# Patient Record
Sex: Male | Born: 1980 | Race: White | Hispanic: No | Marital: Married | Smoking: Former smoker
Health system: Southern US, Community
[De-identification: ages and names within clinical notes are randomized; demographics above are authoritative.]

## PROBLEM LIST (undated history)

## (undated) DIAGNOSIS — B019 Varicella without complication: Secondary | ICD-10-CM

## (undated) DIAGNOSIS — J020 Streptococcal pharyngitis: Secondary | ICD-10-CM

## (undated) DIAGNOSIS — Z789 Other specified health status: Secondary | ICD-10-CM

## (undated) HISTORY — PX: OTHER SURGICAL HISTORY: SHX169

## (undated) HISTORY — DX: Streptococcal pharyngitis: J02.0

## (undated) HISTORY — PX: NO PAST SURGERIES: SHX2092

## (undated) HISTORY — DX: Varicella without complication: B01.9

---

## 2009-03-22 ENCOUNTER — Ambulatory Visit: Payer: Self-pay | Admitting: Family Medicine

## 2009-03-22 DIAGNOSIS — F17200 Nicotine dependence, unspecified, uncomplicated: Secondary | ICD-10-CM | POA: Insufficient documentation

## 2009-03-22 DIAGNOSIS — R21 Rash and other nonspecific skin eruption: Secondary | ICD-10-CM | POA: Insufficient documentation

## 2009-03-23 ENCOUNTER — Encounter: Payer: Self-pay | Admitting: Family Medicine

## 2009-11-10 ENCOUNTER — Ambulatory Visit: Payer: Self-pay | Admitting: Family Medicine

## 2010-09-26 NOTE — Assessment & Plan Note (Signed)
Summary: ? SPIDER BITE/ 3:45   Vital Signs:  Patient profile:   30 year old male Height:      73 inches Weight:      264.25 pounds BMI:     34.99 Temp:     98.4 degrees F oral Pulse rate:   88 / minute Pulse rhythm:   regular BP sitting:   126 / 84  (left arm) Cuff size:   large  Vitals Entered By: Delilah Shan CMA Duncan Dull) (November 10, 2009 2:11 PM) CC: ? spider bite   History of Present Illness: 30 yo here for ?spider bite. Woke up this morning with what appeared to be small insect bite on right upper arm. Since then, it has become bigger, pus filled, with small ring of erythema around it. Erythema is not spreading.  No fevers, chills, nausea, vomiting, body aches, HA or other systemic symptoms. He did not see an insect bite him.  He is a Emergency planning/management officer, he is worried that if it pops on its own will become infected. No personal contact with anyone with similar lesions. Did not burn himself.  Tetanus UTD.  Current Medications (verified): 1)  Doxycycline Hyclate 100 Mg Caps (Doxycycline Hyclate) .... Take 1 Tab Twice A Day X 10 Days  Allergies (verified): No Known Drug Allergies  Review of Systems      See HPI General:  Denies chills, fever, and malaise. GI:  Denies diarrhea, nausea, and vomiting.  Physical Exam  General:  Well-developed,well-nourished,in no acute distress; alert,appropriate and cooperative throughout examination Skin:  2 cm liquid filled pustule, mild surrounding erythema, no necrosis Axillary Nodes:  No palpable lymphadenopathy Psych:  Cognition and judgment appear intact. Alert and cooperative with normal attention span and concentration. 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  Has central bliseter, no mottling and amound of surrounding erythema.   No systemic symptoms. Cleaned and drained,  applied band aid.  Advised cold compresses, elevation. Will give doxycline 100 mg two times a day x 10 days. Pt advised to take pictures of it since difficult to see, call MD on call if develops systemic symptoms.  Complete Medication List: 1)  Doxycycline Hyclate 100 Mg Caps (Doxycycline hyclate) .... Take 1 tab twice a day x 10 days Prescriptions: DOXYCYCLINE HYCLATE 100 MG CAPS (DOXYCYCLINE HYCLATE) Take 1 tab twice a day x 10 days  #20 x 0   Entered and Authorized by:   Ruthe Mannan MD   Signed by:   Ruthe Mannan MD on 11/10/2009   Method used:   Electronically to        [facility] (retail)       [address], [address]       [place], [place]  [address]       Ph: [phone]       Fax: [phone]   RxID:   [id number]   Prior Medications (reviewed today): None Current Allergies (reviewed today): No known allergies    Procedure Note  Incision & Drainage:  Procedure # 1: I & D    Size (in cm): 2.0 x 3.0    Region: medial    Location: arm-upper-right    Instrument used: #10 blade    Anesthesia: none    Deep Suture: none  Cleaned and prepped with: alcohol and  betadine Wound dressing: bacitracin and bandaid  TD Result Date:  07/02/2008 TD Result:  historical TD Next Due:  10 yr

## 2012-01-30 ENCOUNTER — Encounter (HOSPITAL_BASED_OUTPATIENT_CLINIC_OR_DEPARTMENT_OTHER): Payer: Self-pay | Admitting: *Deleted
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
## 2012-01-31 ENCOUNTER — Encounter (HOSPITAL_BASED_OUTPATIENT_CLINIC_OR_DEPARTMENT_OTHER): Payer: Self-pay | Admitting: Anesthesiology

## 2012-01-31 ENCOUNTER — Ambulatory Visit (HOSPITAL_BASED_OUTPATIENT_CLINIC_OR_DEPARTMENT_OTHER)
Admission: RE | Admit: 2012-01-31 | Discharge: 2012-01-31 | Disposition: A | Discharge status: Home / Self Care | Payer: Worker's Compensation | Source: Ambulatory Visit | Attending: Orthopedic Surgery | Admitting: Orthopedic Surgery

## 2012-01-31 ENCOUNTER — Encounter (HOSPITAL_BASED_OUTPATIENT_CLINIC_OR_DEPARTMENT_OTHER)
Admission: RE | Disposition: A | Discharge status: Home / Self Care | Payer: Self-pay | Source: Ambulatory Visit | Attending: Orthopedic Surgery

## 2012-01-31 ENCOUNTER — Encounter (HOSPITAL_BASED_OUTPATIENT_CLINIC_OR_DEPARTMENT_OTHER): Payer: Self-pay

## 2012-01-31 ENCOUNTER — Encounter (HOSPITAL_BASED_OUTPATIENT_CLINIC_OR_DEPARTMENT_OTHER): Payer: Self-pay | Admitting: Orthopedic Surgery

## 2012-01-31 ENCOUNTER — Ambulatory Visit (HOSPITAL_BASED_OUTPATIENT_CLINIC_OR_DEPARTMENT_OTHER): Payer: Worker's Compensation | Admitting: Anesthesiology

## 2012-01-31 DIAGNOSIS — S41109A Unspecified open wound of unspecified upper arm, initial encounter: Secondary | ICD-10-CM | POA: Insufficient documentation

## 2012-01-31 DIAGNOSIS — Y990 Civilian activity done for income or pay: Secondary | ICD-10-CM | POA: Insufficient documentation

## 2012-01-31 DIAGNOSIS — W268XXA Contact with other sharp object(s), not elsewhere classified, initial encounter: Secondary | ICD-10-CM | POA: Insufficient documentation

## 2012-01-31 DIAGNOSIS — Y9289 Other specified places as the place of occurrence of the external cause: Secondary | ICD-10-CM | POA: Insufficient documentation

## 2012-01-31 DIAGNOSIS — S46909A Unspecified injury of unspecified muscle, fascia and tendon at shoulder and upper arm level, unspecified arm, initial encounter: Secondary | ICD-10-CM | POA: Insufficient documentation

## 2012-01-31 HISTORY — DX: Other specified health status: Z78.9

## 2012-01-31 HISTORY — PX: TENDON REPAIR: SHX5111

## 2012-01-31 SURGERY — TENDON REPAIR
Anesthesia: Monitor Anesthesia Care | Site: Arm Lower | Laterality: Right | Wound class: Clean

## 2012-01-31 MED ORDER — HYDROCODONE-ACETAMINOPHEN 5-325 MG PO TABS: ORAL_TABLET | ORAL | Status: AC

## 2012-01-31 MED ORDER — DOXYCYCLINE HYCLATE 100 MG PO TABS: 100.0000 mg | ORAL_TABLET | Freq: Two times a day (BID) | ORAL | Status: AC

## 2012-01-31 MED ORDER — MIDAZOLAM HCL 5 MG/5ML IJ SOLN
INTRAMUSCULAR | Status: DC | PRN
  Administered 2012-01-31: 2 mg via INTRAVENOUS

## 2012-01-31 MED ORDER — FENTANYL CITRATE 0.05 MG/ML IJ SOLN
INTRAMUSCULAR | Status: DC | PRN
  Administered 2012-01-31: 50 ug via INTRAVENOUS
  Administered 2012-01-31: 25 ug via INTRAVENOUS

## 2012-01-31 MED ORDER — PROPOFOL 10 MG/ML IV EMUL
INTRAVENOUS | Status: DC | PRN
  Administered 2012-01-31: 75 ug/kg/min via INTRAVENOUS

## 2012-01-31 MED ORDER — LIDOCAINE HCL 2 % IJ SOLN
INTRAMUSCULAR | Status: DC | PRN
  Administered 2012-01-31: 3.5 mL

## 2012-01-31 MED ORDER — LACTATED RINGERS IV SOLN
INTRAVENOUS | Status: DC
  Administered 2012-01-31 (×2): via INTRAVENOUS

## 2012-01-31 SURGICAL SUPPLY — 80 items
BANDAGE ADHESIVE 1X3 (GAUZE/BANDAGES/DRESSINGS) IMPLANT
BANDAGE ELASTIC 3 VELCRO ST LF (GAUZE/BANDAGES/DRESSINGS) ×2 IMPLANT
BANDAGE GAUZE ELAST BULKY 4 IN (GAUZE/BANDAGES/DRESSINGS) ×2 IMPLANT
BLADE MINI RND TIP GREEN BEAV (BLADE) IMPLANT
BLADE SURG 15 STRL LF DISP TIS (BLADE) ×1 IMPLANT
BLADE SURG 15 STRL SS (BLADE) ×1
BNDG COHESIVE 1X5 TAN STRL LF (GAUZE/BANDAGES/DRESSINGS) IMPLANT
BNDG ELASTIC 2 VLCR STRL LF (GAUZE/BANDAGES/DRESSINGS) IMPLANT
BNDG ESMARK 4X9 LF (GAUZE/BANDAGES/DRESSINGS) ×2 IMPLANT
BRUSH SCRUB EZ PLAIN DRY (MISCELLANEOUS) ×2 IMPLANT
CATH ROBINSON RED A/P 10FR (CATHETERS) IMPLANT
CATH ROBINSON RED A/P 12FR (CATHETERS) IMPLANT
CLOTH BEACON ORANGE TIMEOUT ST (SAFETY) ×2 IMPLANT
CORDS BIPOLAR (ELECTRODE) ×2 IMPLANT
COVER MAYO STAND STRL (DRAPES) ×2 IMPLANT
COVER TABLE BACK 60X90 (DRAPES) ×2 IMPLANT
CUFF TOURNIQUET SINGLE 18IN (TOURNIQUET CUFF) ×2 IMPLANT
DECANTER SPIKE VIAL GLASS SM (MISCELLANEOUS) IMPLANT
DRAIN PENROSE 1/4X12 LTX STRL (WOUND CARE) IMPLANT
DRAPE EXTREMITY T 121X128X90 (DRAPE) ×2 IMPLANT
DRAPE SURG 17X23 STRL (DRAPES) ×2 IMPLANT
GAUZE SPONGE 4X4 16PLY XRAY LF (GAUZE/BANDAGES/DRESSINGS) IMPLANT
GAUZE XEROFORM 1X8 LF (GAUZE/BANDAGES/DRESSINGS) IMPLANT
GLOVE BIO SURGEON STRL SZ 6.5 (GLOVE) ×4 IMPLANT
GLOVE BIOGEL M STRL SZ7.5 (GLOVE) ×2 IMPLANT
GLOVE BIOGEL PI IND STRL 6.5 (GLOVE) ×1 IMPLANT
GLOVE BIOGEL PI INDICATOR 6.5 (GLOVE) ×1
GLOVE ORTHO TXT STRL SZ7.5 (GLOVE) ×2 IMPLANT
GOWN PREVENTION PLUS XLARGE (GOWN DISPOSABLE) ×2 IMPLANT
GOWN PREVENTION PLUS XXLARGE (GOWN DISPOSABLE) ×4 IMPLANT
NEEDLE 27GAX1X1/2 (NEEDLE) ×2 IMPLANT
NEEDLE ADDISON D1/2 CIR (NEEDLE) IMPLANT
NEEDLE HYPO 25X1 1.5 SAFETY (NEEDLE) IMPLANT
NEEDLE KEITH (NEEDLE) IMPLANT
NEEDLE KEITH SZ10 STRAIGHT (NEEDLE) IMPLANT
NS IRRIG 1000ML POUR BTL (IV SOLUTION) ×2 IMPLANT
PACK BASIN DAY SURGERY FS (CUSTOM PROCEDURE TRAY) ×2 IMPLANT
PAD CAST 3X4 CTTN HI CHSV (CAST SUPPLIES) ×1 IMPLANT
PADDING CAST ABS 3INX4YD NS (CAST SUPPLIES)
PADDING CAST ABS 4INX4YD NS (CAST SUPPLIES) ×1
PADDING CAST ABS COTTON 3X4 (CAST SUPPLIES) IMPLANT
PADDING CAST ABS COTTON 4X4 ST (CAST SUPPLIES) ×1 IMPLANT
PADDING CAST COTTON 3X4 STRL (CAST SUPPLIES) ×1
PASSER SUT SWANSON 36MM LOOP (INSTRUMENTS) IMPLANT
SLEEVE SCD COMPRESS KNEE MED (MISCELLANEOUS) IMPLANT
SPLINT PLASTER CAST XFAST 3X15 (CAST SUPPLIES) ×8 IMPLANT
SPLINT PLASTER XTRA FASTSET 3X (CAST SUPPLIES) ×8
SPONGE GAUZE 4X4 12PLY (GAUZE/BANDAGES/DRESSINGS) ×2 IMPLANT
STOCKINETTE 4X48 STRL (DRAPES) ×2 IMPLANT
STOCKINETTE 6  STRL (DRAPES)
STOCKINETTE 6 STRL (DRAPES) IMPLANT
STRIP CLOSURE SKIN 1/2X4 (GAUZE/BANDAGES/DRESSINGS) ×2 IMPLANT
SUT ETHIBOND 3-0 V-5 (SUTURE) ×2 IMPLANT
SUT ETHILON 5 0 P 3 18 (SUTURE)
SUT FIBERWIRE 3-0 18 TAPR NDL (SUTURE)
SUT FIBERWIRE 4-0 18 TAPR NDL (SUTURE)
SUT MERSILENE 4 0 P 3 (SUTURE) IMPLANT
SUT MERSILENE 6 0 P 1 (SUTURE) IMPLANT
SUT MERSILENE 6 0 S14 DA (SUTURE) IMPLANT
SUT NYLON ETHILON 5-0 P-3 1X18 (SUTURE) IMPLANT
SUT POLY BUTTON 15MM (SUTURE) IMPLANT
SUT PROLENE 2 0 SH DA (SUTURE) IMPLANT
SUT PROLENE 3 0 PS 2 (SUTURE) ×2 IMPLANT
SUT PROLENE 4 0 PS 2 18 (SUTURE) IMPLANT
SUT SILK 4 0 PS 2 (SUTURE) IMPLANT
SUT VIC AB 4-0 P-3 18XBRD (SUTURE) ×1 IMPLANT
SUT VIC AB 4-0 P3 18 (SUTURE) ×1
SUT VIC AB 4-0 SH 27 (SUTURE)
SUT VIC AB 4-0 SH 27XANBCTRL (SUTURE) IMPLANT
SUTURE FIBERWR 3-0 18 TAPR NDL (SUTURE) IMPLANT
SUTURE FIBERWR 4-0 18 TAPR NDL (SUTURE) IMPLANT
SYR 3ML 23GX1 SAFETY (SYRINGE) IMPLANT
SYR 5ML LL (SYRINGE) IMPLANT
SYR BULB 3OZ (MISCELLANEOUS) ×2 IMPLANT
SYR CONTROL 10ML LL (SYRINGE) ×2 IMPLANT
TOWEL OR 17X24 6PK STRL BLUE (TOWEL DISPOSABLE) ×2 IMPLANT
TRAY DSU PREP LF (CUSTOM PROCEDURE TRAY) ×2 IMPLANT
TUBE FEEDING 5FR 15 INCH (TUBING) IMPLANT
UNDERPAD 30X30 INCONTINENT (UNDERPADS AND DIAPERS) ×2 IMPLANT
WATER STERILE IRR 1000ML POUR (IV SOLUTION) IMPLANT

## 2012-01-31 NOTE — Discharge Instructions (Signed)

## 2012-01-31 NOTE — Brief Op Note (Signed)
01/31/2012  1:22 PM  PATIENT:  Ruben Robinson  31 y.o. male  PRE-OPERATIVE DIAGNOSIS:  laceration to right forearm with ER documented tendon laceration  POST-OPERATIVE DIAGNOSIS: 70% laceration of extensor carpi ulnaris  PROCEDURE:   DELAYED PRIMARY REPAIR OF EXTENSOR CARPI ULNARIS TENDON   SURGEON:  Surgeon(s) and Role:    * Wyn Forster., MD - Primary  PHYSICIAN ASSISTANT:   ASSISTANTS: Mallory Shirk.A-C   ANESTHESIA:   local  EBL:     BLOOD ADMINISTERED:none  DRAINS: none   LOCAL MEDICATIONS USED:  XYLOCAINE   SPECIMEN:  No Specimen  DISPOSITION OF SPECIMEN:  N/A  COUNTS:  YES  TOURNIQUET:  * Missing tourniquet times found for documented tourniquets in log:  [id number] *  DICTATION: .Other Dictation: Dictation Number [id number]  PLAN OF CARE: Discharge to home after PACU  PATIENT DISPOSITION:  PACU - hemodynamically stable.

## 2012-01-31 NOTE — Anesthesia Preprocedure Evaluation (Signed)
Anesthesia Evaluation  Patient identified by MRN, date of birth, ID band Patient awake    Reviewed: Allergy & Precautions, H&P , NPO status , Patient's Chart, lab work & pertinent test results  History of Anesthesia Complications Negative for: history of anesthetic complications  Airway Mallampati: I TM Distance: >3 FB Neck ROM: Full    Dental No notable dental hx. (+) Teeth Intact and Dental Advisory Given   Pulmonary neg pulmonary ROS, former smoker (quit a year ago) breath sounds clear to auscultation  Pulmonary exam normal       Cardiovascular negative cardio ROS  Rhythm:Regular Rate:Normal     Neuro/Psych negative neurological ROS     GI/Hepatic negative GI ROS, Neg liver ROS,   Endo/Other  negative endocrine ROS  Renal/GU negative Renal ROS     Musculoskeletal   Abdominal (+) + obese,   Peds  Hematology negative hematology ROS (+)   Anesthesia Other Findings   Reproductive/Obstetrics                           Anesthesia Physical Anesthesia Plan  ASA: II  Anesthesia Plan: MAC   Post-op Pain Management:    Induction: Intravenous  Airway Management Planned: Simple Face Mask  Additional Equipment:   Intra-op Plan:   Post-operative Plan:   Informed Consent: I have reviewed the patients History and Physical, chart, labs and discussed the procedure including the risks, benefits and alternatives for the proposed anesthesia with the patient or authorized representative who has indicated his/her understanding and acceptance.   Dental advisory given  Plan Discussed with: CRNA and Surgeon  Anesthesia Plan Comments: (Plan routine monitors, MAC with local by surgeon  )        Anesthesia Quick Evaluation

## 2012-01-31 NOTE — Op Note (Signed)
[id number] 

## 2012-01-31 NOTE — Transfer of Care (Signed)
Immediate Anesthesia Transfer of Care Note  Patient: Ruben Robinson  Procedure(s) Performed: Procedure(s) (LRB): TENDON REPAIR (Right)  Patient Location: PACU  Anesthesia Type: MAC  Level of Consciousness: awake, alert  and oriented  Airway & Oxygen Therapy: Patient Spontanous Breathing and Patient connected to face mask oxygen  Post-op Assessment: Report given to PACU RN and Post -op Vital signs reviewed and stable  Post vital signs: Reviewed and stable  Complications: No apparent anesthesia complications

## 2012-02-01 ENCOUNTER — Encounter (HOSPITAL_BASED_OUTPATIENT_CLINIC_OR_DEPARTMENT_OTHER): Payer: Self-pay | Admitting: Orthopedic Surgery
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
## 2012-02-02 NOTE — Anesthesia Postprocedure Evaluation (Signed)
Anesthesia Post Note  Patient: Ruben Robinson  Procedure(s) Performed: Procedure(s) (LRB): TENDON REPAIR (Right)  Anesthesia type: MAC  Patient location: PACU  Post pain: Pain level controlled  Post assessment: Patient's Cardiovascular Status Stable  Last Vitals:  Filed Vitals:   01/31/12 1417  BP: 119/63  Pulse: 54  Temp: 36.7 C  Resp: 18    Post vital signs: Reviewed and stable  Level of consciousness: sedated  Complications: No apparent anesthesia complications

## 2012-02-06 ENCOUNTER — Encounter (HOSPITAL_BASED_OUTPATIENT_CLINIC_OR_DEPARTMENT_OTHER): Payer: Self-pay

## 2012-04-09 ENCOUNTER — Ambulatory Visit (HOSPITAL_COMMUNITY)
Admission: EM | Admit: 2012-04-09 | Discharge: 2012-04-11 | Disposition: A | Discharge status: Home / Self Care | Payer: Worker's Compensation | Attending: Emergency Medicine | Admitting: Emergency Medicine

## 2012-04-09 ENCOUNTER — Encounter (HOSPITAL_COMMUNITY)
Admission: EM | Disposition: A | Discharge status: Home / Self Care | Payer: Self-pay | Source: Home / Self Care | Attending: Emergency Medicine

## 2012-04-09 ENCOUNTER — Emergency Department (HOSPITAL_COMMUNITY): Payer: Worker's Compensation | Admitting: Certified Registered Nurse Anesthetist

## 2012-04-09 ENCOUNTER — Inpatient Hospital Stay: Admit: 2012-04-09 | Payer: Self-pay | Admitting: Orthopedic Surgery

## 2012-04-09 ENCOUNTER — Encounter (HOSPITAL_COMMUNITY): Payer: Self-pay | Admitting: Certified Registered Nurse Anesthetist

## 2012-04-09 ENCOUNTER — Emergency Department (HOSPITAL_COMMUNITY): Payer: Worker's Compensation

## 2012-04-09 ENCOUNTER — Encounter (HOSPITAL_COMMUNITY): Payer: Self-pay | Admitting: Emergency Medicine

## 2012-04-09 DIAGNOSIS — S81859A Open bite, unspecified lower leg, initial encounter: Secondary | ICD-10-CM

## 2012-04-09 DIAGNOSIS — S92309B Fracture of unspecified metatarsal bone(s), unspecified foot, initial encounter for open fracture: Secondary | ICD-10-CM | POA: Insufficient documentation

## 2012-04-09 DIAGNOSIS — S92902A Unspecified fracture of left foot, initial encounter for closed fracture: Secondary | ICD-10-CM

## 2012-04-09 DIAGNOSIS — S81009A Unspecified open wound, unspecified knee, initial encounter: Secondary | ICD-10-CM | POA: Insufficient documentation

## 2012-04-09 DIAGNOSIS — S81059A Open bite, unspecified knee, initial encounter: Secondary | ICD-10-CM

## 2012-04-09 DIAGNOSIS — W540XXA Bitten by dog, initial encounter: Secondary | ICD-10-CM | POA: Diagnosis present

## 2012-04-09 DIAGNOSIS — S92302B Fracture of unspecified metatarsal bone(s), left foot, initial encounter for open fracture: Secondary | ICD-10-CM | POA: Diagnosis present

## 2012-04-09 DIAGNOSIS — S92213A Displaced fracture of cuboid bone of unspecified foot, initial encounter for closed fracture: Secondary | ICD-10-CM | POA: Insufficient documentation

## 2012-04-09 DIAGNOSIS — W3400XA Accidental discharge from unspecified firearms or gun, initial encounter: Secondary | ICD-10-CM

## 2012-04-09 DIAGNOSIS — Z1810 Retained metal fragments, unspecified: Secondary | ICD-10-CM | POA: Insufficient documentation

## 2012-04-09 DIAGNOSIS — S91309A Unspecified open wound, unspecified foot, initial encounter: Secondary | ICD-10-CM | POA: Insufficient documentation

## 2012-04-09 DIAGNOSIS — S91009A Unspecified open wound, unspecified ankle, initial encounter: Secondary | ICD-10-CM | POA: Insufficient documentation

## 2012-04-09 DIAGNOSIS — W320XXA Accidental handgun discharge, initial encounter: Secondary | ICD-10-CM | POA: Insufficient documentation

## 2012-04-09 DIAGNOSIS — S92919A Unspecified fracture of unspecified toe(s), initial encounter for closed fracture: Secondary | ICD-10-CM | POA: Insufficient documentation

## 2012-04-09 DIAGNOSIS — S92309A Fracture of unspecified metatarsal bone(s), unspecified foot, initial encounter for closed fracture: Secondary | ICD-10-CM | POA: Insufficient documentation

## 2012-04-09 HISTORY — PX: I & D EXTREMITY: SHX5045

## 2012-04-09 HISTORY — DX: Accidental discharge from unspecified firearms or gun, initial encounter: W34.00XA

## 2012-04-09 HISTORY — PX: PERCUTANEOUS PINNING: SHX2209

## 2012-04-09 HISTORY — DX: Open bite, unspecified lower leg, initial encounter: W54.0XXA

## 2012-04-09 LAB — CBC WITH DIFFERENTIAL/PLATELET
Basophils Relative: 0 % (ref 0–1)
Eosinophils Absolute: 0 10*3/uL (ref 0.0–0.7)
Eosinophils Relative: 0 % (ref 0–5)
HCT: 41.3 % (ref 39.0–52.0)
Hemoglobin: 14.5 g/dL (ref 13.0–17.0)
MCH: 29.3 pg (ref 26.0–34.0)
MCHC: 35.1 g/dL (ref 30.0–36.0)
Monocytes Absolute: 0.8 10*3/uL (ref 0.1–1.0)
Monocytes Relative: 5 % (ref 3–12)
RDW: 12.3 % (ref 11.5–15.5)

## 2012-04-09 LAB — BASIC METABOLIC PANEL
BUN: 21 mg/dL (ref 6–23)
Calcium: 9.1 mg/dL (ref 8.4–10.5)
Creatinine, Ser: 1.07 mg/dL (ref 0.50–1.35)
GFR calc Af Amer: >90 mL/min (ref >90)
GFR calc non Af Amer: >90 mL/min (ref >90)

## 2012-04-09 SURGERY — IRRIGATION AND DEBRIDEMENT EXTREMITY
Anesthesia: General | Site: Foot | Laterality: Left | Wound class: Contaminated

## 2012-04-09 MED ORDER — LACTATED RINGERS IV SOLN
INTRAVENOUS | Status: DC | PRN
  Administered 2012-04-09: 18:00:00 via INTRAVENOUS

## 2012-04-09 MED ORDER — MIDAZOLAM HCL 5 MG/5ML IJ SOLN
INTRAMUSCULAR | Status: DC | PRN
  Administered 2012-04-09: 2 mg via INTRAVENOUS

## 2012-04-09 MED ORDER — FENTANYL CITRATE 0.05 MG/ML IJ SOLN
INTRAMUSCULAR | Status: DC | PRN
  Administered 2012-04-09: 50 ug via INTRAVENOUS
  Administered 2012-04-09: 100 ug via INTRAVENOUS
  Administered 2012-04-09 (×2): 50 ug via INTRAVENOUS

## 2012-04-09 MED ORDER — ONDANSETRON HCL 4 MG/2ML IJ SOLN: 4.0000 mg | Freq: Four times a day (QID) | INTRAMUSCULAR | Status: DC | PRN

## 2012-04-09 MED ORDER — ONDANSETRON HCL 4 MG PO TABS: 4.0000 mg | ORAL_TABLET | Freq: Four times a day (QID) | ORAL | Status: DC | PRN

## 2012-04-09 MED ORDER — HYDROMORPHONE HCL PF 1 MG/ML IJ SOLN
0.2500 mg | INTRAMUSCULAR | Status: DC | PRN
  Administered 2012-04-09 (×4): 0.5 mg via INTRAVENOUS

## 2012-04-09 MED ORDER — OXYCODONE-ACETAMINOPHEN 5-325 MG PO TABS
1.0000 | ORAL_TABLET | ORAL | Status: DC | PRN
  Administered 2012-04-09 – 2012-04-11 (×6): 2 via ORAL
  Filled 2012-04-09 (×6): qty 2

## 2012-04-09 MED ORDER — HYDROMORPHONE HCL PF 1 MG/ML IJ SOLN
INTRAMUSCULAR | Status: AC
  Filled 2012-04-09: qty 1

## 2012-04-09 MED ORDER — HYDROMORPHONE HCL PF 1 MG/ML IJ SOLN
1.0000 mg | INTRAMUSCULAR | Status: DC | PRN
  Administered 2012-04-10 (×2): 1 mg via INTRAVENOUS
  Administered 2012-04-10 (×2): 2 mg via INTRAVENOUS
  Administered 2012-04-10 (×2): 1 mg via INTRAVENOUS
  Administered 2012-04-11 (×2): 2 mg via INTRAVENOUS
  Administered 2012-04-11: 1 mg via INTRAVENOUS
  Filled 2012-04-09 (×2): qty 1
  Filled 2012-04-09: qty 2
  Filled 2012-04-09 (×4): qty 1
  Filled 2012-04-09 (×3): qty 2

## 2012-04-09 MED ORDER — PROPOFOL 10 MG/ML IV EMUL
INTRAVENOUS | Status: DC | PRN
  Administered 2012-04-09: 200 mg via INTRAVENOUS

## 2012-04-09 MED ORDER — ASPIRIN EC 325 MG PO TBEC
325.0000 mg | DELAYED_RELEASE_TABLET | Freq: Two times a day (BID) | ORAL | Status: DC
  Administered 2012-04-10 – 2012-04-11 (×3): 325 mg via ORAL
  Filled 2012-04-09 (×5): qty 1

## 2012-04-09 MED ORDER — HYDROMORPHONE HCL PF 1 MG/ML IJ SOLN
1.0000 mg | Freq: Once | INTRAMUSCULAR | Status: AC
  Administered 2012-04-09: 1 mg via INTRAVENOUS
  Filled 2012-04-09: qty 1

## 2012-04-09 MED ORDER — PIPERACILLIN-TAZOBACTAM 3.375 G IVPB
3.3750 g | Freq: Once | INTRAVENOUS | Status: AC
  Administered 2012-04-09: 3.375 g via INTRAVENOUS
  Filled 2012-04-09: qty 50

## 2012-04-09 MED ORDER — PIPERACILLIN-TAZOBACTAM 3.375 G IVPB
3.3750 g | Freq: Four times a day (QID) | INTRAVENOUS | Status: AC
  Administered 2012-04-10 (×3): 3.375 g via INTRAVENOUS
  Filled 2012-04-09 (×3): qty 50

## 2012-04-09 MED ORDER — DEXTROSE-NACL 5-0.45 % IV SOLN
INTRAVENOUS | Status: DC
  Administered 2012-04-09: 23:00:00 via INTRAVENOUS
  Administered 2012-04-11: 75 mL via INTRAVENOUS

## 2012-04-09 MED ORDER — LIDOCAINE HCL (CARDIAC) 20 MG/ML IV SOLN
INTRAVENOUS | Status: DC | PRN
  Administered 2012-04-09: 100 mg via INTRAVENOUS

## 2012-04-09 MED ORDER — ONDANSETRON HCL 4 MG/2ML IJ SOLN
INTRAMUSCULAR | Status: DC | PRN
  Administered 2012-04-09: 4 mg via INTRAVENOUS

## 2012-04-09 MED ORDER — LORAZEPAM 1 MG PO TABS
1.0000 mg | ORAL_TABLET | Freq: Once | ORAL | Status: AC
  Administered 2012-04-09: 1 mg via ORAL
  Filled 2012-04-09 (×2): qty 1

## 2012-04-09 MED ORDER — ONDANSETRON HCL 4 MG/2ML IJ SOLN
4.0000 mg | Freq: Once | INTRAMUSCULAR | Status: AC
  Administered 2012-04-09: 4 mg via INTRAVENOUS
  Filled 2012-04-09: qty 2

## 2012-04-09 MED ORDER — ZOLPIDEM TARTRATE 5 MG PO TABS: 5.0000 mg | ORAL_TABLET | Freq: Every evening | ORAL | Status: DC | PRN

## 2012-04-09 MED ORDER — DROPERIDOL 2.5 MG/ML IJ SOLN: 0.6250 mg | INTRAMUSCULAR | Status: DC | PRN

## 2012-04-09 SURGICAL SUPPLY — 70 items
BAG DECANTER FOR FLEXI CONT (MISCELLANEOUS) IMPLANT
BANDAGE ELASTIC 3 VELCRO ST LF (GAUZE/BANDAGES/DRESSINGS) IMPLANT
BANDAGE ELASTIC 4 VELCRO ST LF (GAUZE/BANDAGES/DRESSINGS) ×8 IMPLANT
BANDAGE ELASTIC 6 VELCRO ST LF (GAUZE/BANDAGES/DRESSINGS) IMPLANT
BANDAGE ESMARK 6X9 LF (GAUZE/BANDAGES/DRESSINGS) IMPLANT
BANDAGE GAUZE ELAST BULKY 4 IN (GAUZE/BANDAGES/DRESSINGS) ×8 IMPLANT
BENZOIN TINCTURE PRP APPL 2/3 (GAUZE/BANDAGES/DRESSINGS) IMPLANT
BLADE SURG ROTATE 9660 (MISCELLANEOUS) IMPLANT
BNDG COHESIVE 4X5 TAN STRL (GAUZE/BANDAGES/DRESSINGS) ×2 IMPLANT
BNDG ESMARK 6X9 LF (GAUZE/BANDAGES/DRESSINGS)
CAP PIN PROTECTOR ORTHO WHT (CAP) ×2 IMPLANT
CLOTH BEACON ORANGE TIMEOUT ST (SAFETY) ×2 IMPLANT
CONT SPEC STER OR (MISCELLANEOUS) ×2 IMPLANT
COVER SURGICAL LIGHT HANDLE (MISCELLANEOUS) ×2 IMPLANT
CUFF TOURNIQUET SINGLE 18IN (TOURNIQUET CUFF) IMPLANT
CUFF TOURNIQUET SINGLE 24IN (TOURNIQUET CUFF) IMPLANT
CUFF TOURNIQUET SINGLE 34IN LL (TOURNIQUET CUFF) ×2 IMPLANT
CUFF TOURNIQUET SINGLE 44IN (TOURNIQUET CUFF) IMPLANT
DRAPE OEC MINIVIEW 54X84 (DRAPES) ×2 IMPLANT
DRAPE U-SHAPE 47X51 STRL (DRAPES) ×2 IMPLANT
DRSG ADAPTIC 3X8 NADH LF (GAUZE/BANDAGES/DRESSINGS) IMPLANT
DRSG EMULSION OIL 3X3 NADH (GAUZE/BANDAGES/DRESSINGS) IMPLANT
DRSG PAD ABDOMINAL 8X10 ST (GAUZE/BANDAGES/DRESSINGS) ×2 IMPLANT
DURAPREP 26ML APPLICATOR (WOUND CARE) ×2 IMPLANT
ELECT CAUTERY BLADE 6.4 (BLADE) IMPLANT
ELECT REM PT RETURN 9FT ADLT (ELECTROSURGICAL)
ELECTRODE REM PT RTRN 9FT ADLT (ELECTROSURGICAL) IMPLANT
GAUZE XEROFORM 1X8 LF (GAUZE/BANDAGES/DRESSINGS) IMPLANT
GAUZE XEROFORM 5X9 LF (GAUZE/BANDAGES/DRESSINGS) ×2 IMPLANT
GLOVE BIO SURGEON STRL SZ 6.5 (GLOVE) ×2 IMPLANT
GLOVE BIOGEL PI IND STRL 8 (GLOVE) ×2 IMPLANT
GLOVE BIOGEL PI INDICATOR 8 (GLOVE) ×2
GLOVE ECLIPSE 7.5 STRL STRAW (GLOVE) ×4 IMPLANT
GOWN PREVENTION PLUS XLARGE (GOWN DISPOSABLE) ×2 IMPLANT
GOWN SRG XL XLNG 56XLVL 4 (GOWN DISPOSABLE) ×1 IMPLANT
GOWN STRL NON-REIN LRG LVL3 (GOWN DISPOSABLE) ×4 IMPLANT
GOWN STRL NON-REIN XL XLG LVL4 (GOWN DISPOSABLE) ×1
HANDPIECE INTERPULSE COAX TIP (DISPOSABLE)
K-WIRE PROS 0.6X70 (WIRE) ×2
KIT BASIN OR (CUSTOM PROCEDURE TRAY) ×2 IMPLANT
KIT ROOM TURNOVER OR (KITS) ×2 IMPLANT
KWIRE PROS 0.6X70 (WIRE) ×1 IMPLANT
MANIFOLD NEPTUNE II (INSTRUMENTS) ×2 IMPLANT
NS IRRIG 1000ML POUR BTL (IV SOLUTION) ×2 IMPLANT
PACK ORTHO EXTREMITY (CUSTOM PROCEDURE TRAY) ×2 IMPLANT
PAD ARMBOARD 7.5X6 YLW CONV (MISCELLANEOUS) ×2 IMPLANT
PAD CAST 4YDX4 CTTN HI CHSV (CAST SUPPLIES) ×1 IMPLANT
PADDING CAST ABS 4INX4YD NS (CAST SUPPLIES) ×1
PADDING CAST ABS COTTON 4X4 ST (CAST SUPPLIES) ×1 IMPLANT
PADDING CAST COTTON 4X4 STRL (CAST SUPPLIES) ×1
SET HNDPC FAN SPRY TIP SCT (DISPOSABLE) IMPLANT
SPONGE GAUZE 4X4 12PLY (GAUZE/BANDAGES/DRESSINGS) ×4 IMPLANT
SPONGE LAP 18X18 X RAY DECT (DISPOSABLE) IMPLANT
STOCKINETTE IMPERVIOUS 9X36 MD (GAUZE/BANDAGES/DRESSINGS) ×2 IMPLANT
STOCKINETTE TUBULAR SYNTH 4IN (CAST SUPPLIES) ×2 IMPLANT
STRIP CLOSURE SKIN 1/2X4 (GAUZE/BANDAGES/DRESSINGS) IMPLANT
SUCTION FRAZIER TIP 10 FR DISP (SUCTIONS) ×2 IMPLANT
SUT ETHILON 2 0 FS 18 (SUTURE) ×4 IMPLANT
SUT ETHILON 2 0 PSLX (SUTURE) ×2 IMPLANT
SUT ETHILON 4 0 P 3 18 (SUTURE) IMPLANT
SUT ETHILON 5 0 P 3 18 (SUTURE)
SUT NYLON ETHILON 5-0 P-3 1X18 (SUTURE) IMPLANT
SUT PROLENE 4 0 P 3 18 (SUTURE) IMPLANT
TOWEL OR 17X24 6PK STRL BLUE (TOWEL DISPOSABLE) ×2 IMPLANT
TOWEL OR 17X26 10 PK STRL BLUE (TOWEL DISPOSABLE) ×2 IMPLANT
TUBE ANAEROBIC SPECIMEN COL (MISCELLANEOUS) IMPLANT
TUBE CONNECTING 12X1/4 (SUCTIONS) ×2 IMPLANT
UNDERPAD 30X30 INCONTINENT (UNDERPADS AND DIAPERS) ×2 IMPLANT
WATER STERILE IRR 1000ML POUR (IV SOLUTION) IMPLANT
YANKAUER SUCT BULB TIP NO VENT (SUCTIONS) ×2 IMPLANT

## 2012-04-09 NOTE — ED Notes (Signed)
Pt bleed through dressing.
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
## 2012-04-09 NOTE — ED Notes (Signed)
Pt has good pulses in left foot.

## 2012-04-09 NOTE — Progress Notes (Signed)
Orthopedic Tech Progress Note Patient Details:  Ruben Robinson May 08, 1981 [id number]  Patient ID: Ruben Robinson, male   DOB: 1980-10-19, 31 y.o.   MRN: [id number] Viewed order from doctor's order list  Nikki Dom 04/09/2012, 8:24 PM
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
## 2012-04-09 NOTE — Anesthesia Postprocedure Evaluation (Signed)
Anesthesia Post Note  Patient: Ruben Robinson  Procedure(s) Performed: Procedure(s) (LRB): IRRIGATION AND DEBRIDEMENT EXTREMITY (Left) PERCUTANEOUS PINNING EXTREMITY (Left)  Anesthesia type: general  Patient location: PACU  Post pain: Pain level controlled  Post assessment: Patient's Cardiovascular Status Stable  Last Vitals:  Filed Vitals:   04/09/12 2015  BP: 127/70  Pulse:   Temp:   Resp:     Post vital signs: Reviewed and stable  Level of consciousness: sedated  Complications: No apparent anesthesia complications

## 2012-04-09 NOTE — ED Notes (Signed)
Changed dressing on left foot with 4X4s then wrapped it tightly.

## 2012-04-09 NOTE — Progress Notes (Signed)
Orthopedic Tech Progress Note Patient Details:  Ruben Robinson 11-24-1980 [id number]  Ortho Devices Type of Ortho Device: Crutches Ortho Device/Splint Interventions: [name], [name] 04/09/2012, 8:24 PM

## 2012-04-09 NOTE — Anesthesia Preprocedure Evaluation (Addendum)
Anesthesia Evaluation  Patient identified by MRN, date of birth, ID band Patient awake, Patient confused and Patient unresponsive    Reviewed: Allergy & Precautions, H&P , NPO status , Patient's Chart, lab work & pertinent test results  Airway Mallampati: I TM Distance: >3 FB Neck ROM: Full    Dental  (+) Teeth Intact and Dental Advisory Given   Pulmonary former smoker,  breath sounds clear to auscultation  Pulmonary exam normal       Cardiovascular Rhythm:Regular Rate:Normal     Neuro/Psych    GI/Hepatic   Endo/Other    Renal/GU      Musculoskeletal   Abdominal   Peds  Hematology   Anesthesia Other Findings   Reproductive/Obstetrics                          Anesthesia Physical Anesthesia Plan  ASA: I and Emergent  Anesthesia Plan: General   Post-op Pain Management:    Induction: Intravenous  Airway Management Planned: LMA  Additional Equipment:   Intra-op Plan:   Post-operative Plan: Extubation in OR  Informed Consent: I have reviewed the patients History and Physical, chart, labs and discussed the procedure including the risks, benefits and alternatives for the proposed anesthesia with the patient or authorized representative who has indicated his/her understanding and acceptance.   Dental advisory given  Plan Discussed with: CRNA, Anesthesiologist and Surgeon  Anesthesia Plan Comments:        Anesthesia Quick Evaluation

## 2012-04-09 NOTE — Transfer of Care (Signed)
Immediate Anesthesia Transfer of Care Note  Patient: Ruben Robinson  Procedure(s) Performed: Procedure(s) (LRB): IRRIGATION AND DEBRIDEMENT EXTREMITY (Left) PERCUTANEOUS PINNING EXTREMITY (Left)  Patient Location: PACU  Anesthesia Type: General  Level of Consciousness: awake, alert , oriented and patient cooperative  Airway & Oxygen Therapy: Patient Spontanous Breathing and Patient connected to nasal cannula oxygen  Post-op Assessment: Report given to PACU RN, Post -op Vital signs reviewed and stable and Patient moving all extremities X 4  Post vital signs: Reviewed and stable  Complications: No apparent anesthesia complications

## 2012-04-09 NOTE — Preoperative (Signed)
Beta Blockers   Reason not to administer Beta Blockers:Not Applicable 

## 2012-04-10 ENCOUNTER — Ambulatory Visit (HOSPITAL_COMMUNITY): Payer: Worker's Compensation

## 2012-04-10 ENCOUNTER — Encounter (HOSPITAL_COMMUNITY): Payer: Self-pay | Admitting: Orthopedic Surgery

## 2012-04-10 MED ORDER — ASPIRIN 325 MG PO TBEC: 325.0000 mg | DELAYED_RELEASE_TABLET | Freq: Two times a day (BID) | ORAL | Status: AC

## 2012-04-10 MED ORDER — OXYCODONE-ACETAMINOPHEN 5-325 MG PO TABS: 1.0000 | ORAL_TABLET | ORAL | Status: AC | PRN

## 2012-04-10 MED ORDER — AMOXICILLIN-POT CLAVULANATE 875-125 MG PO TABS: 1.0000 | ORAL_TABLET | Freq: Two times a day (BID) | ORAL | Status: AC

## 2012-04-10 MED ORDER — IOHEXOL 350 MG/ML SOLN
100.0000 mL | Freq: Once | INTRAVENOUS | Status: AC | PRN
  Administered 2012-04-10: 100 mL via INTRAVENOUS

## 2012-04-10 NOTE — Progress Notes (Signed)
After PT session, pain was extremely intense for Ruben Robinson.  He said, " pain was worse than when I got bitten by the dog yesterday."  He said he got to a sitting position with his legs too the floor, then pain became excruciating.  He said there is no way I can go home today.  [name], PA notified of Ruben Robinson pain, and he said it was OK for [name] to stay another night in hospital for pain management.  Nursing to continue to monitor.

## 2012-04-10 NOTE — Progress Notes (Signed)
Subjective: 1 Day Post-Op Procedure(s) (LRB): IRRIGATION AND DEBRIDEMENT EXTREMITY (Left) PERCUTANEOUS PINNING EXTREMITY (Left) Patient reports pain as moderate.    Objective: Vital signs in last 24 hours: Temp:  [96.9 F (36.1 C)-98.9 F (37.2 C)] 98.2 F (36.8 C) (08/15 0654) Pulse Rate:  [64-100] 64  (08/15 0654) Resp:  [9-26] 18  (08/15 0654) BP: (113-147)/(40-81) 120/40 mmHg (08/15 0654) SpO2:  [98 %-100 %] 98 % (08/15 0654) Weight:  [92.987 kg (205 lb)] 92.987 kg (205 lb) (08/14 1321)  Intake/Output from previous day: 08/14 0701 - 08/15 0700 In: 640 [P.O.:240; I.V.:400] Out: 75 [Blood:75] Intake/Output this shift:     Basename 04/09/12 1406  HGB 14.5    Basename 04/09/12 1406  WBC 14.0*  RBC 4.95  HCT 41.3  PLT 209    Basename 04/09/12 1406  NA 140  K 3.6  CL 105  CO2 22  BUN 21  CREATININE 1.07  GLUCOSE 105*  CALCIUM 9.1   No results found for this basename: LABPT:2,INR:2 in the last 72 hours  Neurologically intact ABD soft Neurovascular intact Sensation intact distally Intact pulses distally Dorsiflexion/Plantar flexion intact Compartment soft  Assessment/Plan: 1 Day Post-Op Procedure(s) (LRB): IRRIGATION AND DEBRIDEMENT EXTREMITY (Left) PERCUTANEOUS PINNING EXTREMITY (Left) Advance diet Up with therapy D/c home today after therapy   Ruben Robinson 04/10/2012, 8:08 AM
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
## 2012-04-11 NOTE — Progress Notes (Signed)
CARE MANAGEMENT NOTE 04/11/2012 04/11/12 1200 Vance Peper, RN BSN Received call from Gevena Cotton RN CM with [facility]-instructed to fax orders for [facility] and DME to her at: [phone]. Phone [phone].

## 2012-08-27 HISTORY — PX: WISDOM TOOTH EXTRACTION: SHX21
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
## 2018-07-22 ENCOUNTER — Encounter: Payer: Self-pay | Admitting: Family Medicine

## 2018-07-22 ENCOUNTER — Ambulatory Visit: Payer: BC Managed Care – PPO | Admitting: Family Medicine

## 2018-07-22 VITALS — BP 130/80 | HR 81 | Temp 99.0°F | Ht 73.0 in | Wt 256.0 lb

## 2018-07-22 VITALS — BP 130/80 | HR 81 | Temp 99.0°F | Ht 73.0 in | Wt 256.8 lb

## 2018-07-22 DIAGNOSIS — R52 Pain, unspecified: Secondary | ICD-10-CM | POA: Diagnosis not present

## 2018-07-22 DIAGNOSIS — R0981 Nasal congestion: Secondary | ICD-10-CM

## 2018-07-22 DIAGNOSIS — J111 Influenza due to unidentified influenza virus with other respiratory manifestations: Secondary | ICD-10-CM

## 2018-07-22 DIAGNOSIS — R05 Cough: Secondary | ICD-10-CM | POA: Diagnosis not present

## 2018-07-22 DIAGNOSIS — R059 Cough, unspecified: Secondary | ICD-10-CM

## 2018-07-22 DIAGNOSIS — R509 Fever, unspecified: Secondary | ICD-10-CM

## 2018-07-22 LAB — POC INFLUENZA A&B (BINAX/QUICKVUE)
Influenza A, POC: NEGATIVE
Influenza B, POC: NEGATIVE

## 2018-07-22 MED ORDER — OSELTAMIVIR PHOSPHATE 75 MG PO CAPS: 75.0000 mg | ORAL_CAPSULE | Freq: Two times a day (BID) | ORAL | 0 refills | Status: DC
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
## 2018-07-22 NOTE — Patient Instructions (Signed)

## 2018-07-22 NOTE — Progress Notes (Signed)
DUPLICATE ENCOUNTER

## 2018-07-22 NOTE — Addendum Note (Signed)
Addended by: Leanora CoverGUSE, Kelden Lavallee on: 07/22/2018 01:09 PM   Modules accepted: Orders, Level of Service

## 2018-08-15 ENCOUNTER — Ambulatory Visit: Payer: BC Managed Care – PPO | Admitting: Internal Medicine

## 2018-09-17 ENCOUNTER — Ambulatory Visit: Payer: BC Managed Care – PPO | Admitting: Internal Medicine

## 2018-09-24 ENCOUNTER — Encounter: Payer: Self-pay | Admitting: Internal Medicine

## 2018-09-24 ENCOUNTER — Ambulatory Visit: Payer: BC Managed Care – PPO | Admitting: Internal Medicine

## 2018-09-24 VITALS — BP 96/64 | HR 71 | Temp 98.7°F | Ht 73.0 in | Wt 254.2 lb

## 2018-09-24 DIAGNOSIS — R739 Hyperglycemia, unspecified: Secondary | ICD-10-CM

## 2018-09-24 DIAGNOSIS — Z23 Encounter for immunization: Secondary | ICD-10-CM | POA: Diagnosis not present

## 2018-09-24 DIAGNOSIS — Z0000 Encounter for general adult medical examination without abnormal findings: Secondary | ICD-10-CM

## 2018-09-24 DIAGNOSIS — Z1159 Encounter for screening for other viral diseases: Secondary | ICD-10-CM

## 2018-09-24 DIAGNOSIS — Z13220 Encounter for screening for lipoid disorders: Secondary | ICD-10-CM

## 2018-09-24 DIAGNOSIS — Z1389 Encounter for screening for other disorder: Secondary | ICD-10-CM | POA: Diagnosis not present

## 2018-09-24 DIAGNOSIS — E669 Obesity, unspecified: Secondary | ICD-10-CM

## 2018-09-24 DIAGNOSIS — Z1329 Encounter for screening for other suspected endocrine disorder: Secondary | ICD-10-CM

## 2018-09-24 DIAGNOSIS — E559 Vitamin D deficiency, unspecified: Secondary | ICD-10-CM

## 2018-09-24 NOTE — Patient Instructions (Signed)

## 2018-09-24 NOTE — Progress Notes (Signed)
Patient not in [facility]. 
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
## 2018-10-03 ENCOUNTER — Other Ambulatory Visit (INDEPENDENT_AMBULATORY_CARE_PROVIDER_SITE_OTHER): Payer: BC Managed Care – PPO

## 2018-10-03 DIAGNOSIS — Z1389 Encounter for screening for other disorder: Secondary | ICD-10-CM

## 2018-10-03 DIAGNOSIS — Z13220 Encounter for screening for lipoid disorders: Secondary | ICD-10-CM | POA: Diagnosis not present

## 2018-10-03 DIAGNOSIS — R739 Hyperglycemia, unspecified: Secondary | ICD-10-CM | POA: Diagnosis not present

## 2018-10-03 DIAGNOSIS — Z0000 Encounter for general adult medical examination without abnormal findings: Secondary | ICD-10-CM | POA: Diagnosis not present

## 2018-10-03 DIAGNOSIS — E559 Vitamin D deficiency, unspecified: Secondary | ICD-10-CM | POA: Diagnosis not present

## 2018-10-03 DIAGNOSIS — Z1329 Encounter for screening for other suspected endocrine disorder: Secondary | ICD-10-CM

## 2018-10-03 DIAGNOSIS — Z1159 Encounter for screening for other viral diseases: Secondary | ICD-10-CM

## 2018-10-03 LAB — CBC WITH DIFFERENTIAL/PLATELET
BASOS ABS: 0 10*3/uL (ref 0.0–0.1)
Basophils Relative: 0.8 % (ref 0.0–3.0)
EOS ABS: 0.1 10*3/uL (ref 0.0–0.7)
Eosinophils Relative: 1.6 % (ref 0.0–5.0)
HEMATOCRIT: 44.4 % (ref 39.0–52.0)
HEMOGLOBIN: 15 g/dL (ref 13.0–17.0)
Lymphs Abs: 2.6 10*3/uL (ref 0.7–4.0)
MCHC: 33.9 g/dL (ref 30.0–36.0)
MCV: 84.7 fl (ref 78.0–100.0)
MONOS PCT: 7.4 % (ref 3.0–12.0)
Monocytes Absolute: 0.3 10*3/uL (ref 0.1–1.0)
Neutro Abs: 1.6 10*3/uL (ref 1.4–7.7)
Neutrophils Relative %: 33.9 % — ABNORMAL LOW (ref 43.0–77.0)
PLATELETS: 240 10*3/uL (ref 150.0–400.0)
RBC: 5.24 Mil/uL (ref 4.22–5.81)
RDW: 13.6 % (ref 11.5–15.5)
WBC: 4.6 10*3/uL (ref 4.0–10.5)

## 2018-10-03 LAB — URINALYSIS, ROUTINE W REFLEX MICROSCOPIC
Bacteria, UA: NONE SEEN /HPF
Bilirubin Urine: NEGATIVE
Glucose, UA: NEGATIVE
HYALINE CAST: NONE SEEN /LPF
Hgb urine dipstick: NEGATIVE
Nitrite: NEGATIVE
PROTEIN: NEGATIVE
RBC / HPF: NONE SEEN /HPF (ref 0–2)
Specific Gravity, Urine: 1.015 (ref 1.001–1.03)
Squamous Epithelial / HPF: NONE SEEN /HPF (ref <=5)
pH: 6 (ref 5.0–8.0)

## 2018-10-03 LAB — COMPREHENSIVE METABOLIC PANEL
ALBUMIN: 4.4 g/dL (ref 3.5–5.2)
ALK PHOS: 70 U/L (ref 39–117)
ALT: 26 U/L (ref 0–53)
AST: 24 U/L (ref 0–37)
BUN: 19 mg/dL (ref 6–23)
CALCIUM: 9 mg/dL (ref 8.4–10.5)
CO2: 24 meq/L (ref 19–32)
Chloride: 106 mEq/L (ref 96–112)
Creatinine, Ser: 1.24 mg/dL (ref 0.40–1.50)
GFR: 65.31 mL/min (ref >60.00)
Glucose, Bld: 80 mg/dL (ref 70–99)
Potassium: 4 mEq/L (ref 3.5–5.1)
Sodium: 139 mEq/L (ref 135–145)
Total Bilirubin: 0.6 mg/dL (ref 0.2–1.2)
Total Protein: 7 g/dL (ref 6.0–8.3)

## 2018-10-03 LAB — LIPID PANEL
CHOL/HDL RATIO: 6
CHOLESTEROL: 196 mg/dL (ref 0–200)
HDL: 30.8 mg/dL — AB (ref >39.00)
LDL Cholesterol: 151 mg/dL — ABNORMAL HIGH (ref 0–99)
NonHDL: 165.62
TRIGLYCERIDES: 74 mg/dL (ref 0.0–149.0)
VLDL: 14.8 mg/dL (ref 0.0–40.0)

## 2018-10-03 LAB — HEMOGLOBIN A1C: Hgb A1c MFr Bld: 5.2 % (ref 4.6–6.5)

## 2018-10-03 LAB — VITAMIN D 25 HYDROXY (VIT D DEFICIENCY, FRACTURES): VITD: 27.59 ng/mL — ABNORMAL LOW (ref 30.00–100.00)

## 2018-10-03 LAB — T4, FREE: FREE T4: 0.98 ng/dL (ref 0.60–1.60)

## 2018-10-03 LAB — TSH: TSH: 2.57 u[IU]/mL (ref 0.35–4.50)

## 2018-10-03 NOTE — Addendum Note (Signed)
Addended by: Tyler Cubit S on: 10/03/2018 08:41 AM   Modules accepted: Orders  

## 2018-10-03 NOTE — Addendum Note (Signed)
Addended by: Warden Fillers on: 10/03/2018 09:00 AM   Modules accepted: Orders

## 2018-10-03 NOTE — Addendum Note (Signed)
Addended by: Warden Fillers on: 10/03/2018 09:19 AM   Modules accepted: Orders

## 2018-10-03 NOTE — Addendum Note (Signed)
Addended by: Warden Fillers on: 10/03/2018 12:43 PM   Modules accepted: Orders

## 2018-10-03 NOTE — Addendum Note (Signed)
Addended by: Warden Fillers on: 10/03/2018 08:41 AM   Modules accepted: Orders

## 2018-10-06 LAB — MEASLES/MUMPS/RUBELLA IMMUNITY
MUMPS IGG: 156 [AU]/ml
Rubella: 10.5 index
Rubeola IgG: >300 AU/mL

## 2018-10-06 LAB — HEPATITIS B SURFACE ANTIBODY, QUANTITATIVE: Hepatitis B-Post: <5 m[IU]/mL — ABNORMAL LOW (ref >=10)

## 2019-03-25 ENCOUNTER — Ambulatory Visit: Payer: BC Managed Care – PPO | Admitting: Internal Medicine

## 2019-04-03 ENCOUNTER — Other Ambulatory Visit: Payer: Self-pay

## 2019-04-03 DIAGNOSIS — Z20822 Contact with and (suspected) exposure to covid-19: Secondary | ICD-10-CM

## 2019-04-04 ENCOUNTER — Telehealth: Payer: Self-pay

## 2019-04-04 NOTE — Telephone Encounter (Signed)
Pt called for results Pt informed results are not ready.

## 2019-04-05 LAB — NOVEL CORONAVIRUS, NAA: SARS-CoV-2, NAA: NOT DETECTED

## 2019-04-07 ENCOUNTER — Ambulatory Visit: Payer: BC Managed Care – PPO | Admitting: Internal Medicine

## 2021-03-23 ENCOUNTER — Other Ambulatory Visit: Payer: Self-pay

## 2021-03-23 ENCOUNTER — Encounter: Payer: Self-pay | Admitting: Family Medicine

## 2021-03-23 ENCOUNTER — Ambulatory Visit: Payer: BC Managed Care – PPO | Admitting: Family Medicine

## 2021-03-23 VITALS — BP 106/68 | HR 64 | Temp 98.6°F | Ht 73.0 in | Wt 247.0 lb

## 2021-03-23 DIAGNOSIS — Z13220 Encounter for screening for lipoid disorders: Secondary | ICD-10-CM | POA: Diagnosis not present

## 2021-03-23 DIAGNOSIS — R7989 Other specified abnormal findings of blood chemistry: Secondary | ICD-10-CM | POA: Diagnosis not present

## 2021-03-23 DIAGNOSIS — M7541 Impingement syndrome of right shoulder: Secondary | ICD-10-CM | POA: Diagnosis not present

## 2021-03-23 DIAGNOSIS — Z0000 Encounter for general adult medical examination without abnormal findings: Secondary | ICD-10-CM

## 2021-03-23 MED ORDER — MELOXICAM 15 MG PO TABS: 15.0000 mg | ORAL_TABLET | Freq: Every day | ORAL | 0 refills | Status: DC
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
## 2021-03-23 NOTE — Assessment & Plan Note (Signed)
Atraumatic right shoulder symptoms initially noted towards the end of last year, initially associated with night pain and weakness, this is since improved.  Pain to the lateral shoulder with overhead and reaching activities, mild weakness still noted with resistance activities, no radiation distally, has previously noted mild neck involvement, no paresthesias.  Physical examination shows full range of motion with pain overhead, resisted supraspinatus testing reveals 5/5 strength, elicits pain, contralateral and remainder rotator cuff is benign, positive Neer's, positive Hawkins, provocative testing otherwise negative.  Clinical features are most consistent with supraspinatus related impingement, given the chronicity of symptoms have advised 2-week course of meloxicam, home-based rehab, and he can continue his current athletic activities using symptoms as a guide.  If pain persists, he would benefit from initial x-rays and further evaluation in office.  He can otherwise follow-up as needed.

## 2021-03-23 NOTE — Assessment & Plan Note (Signed)
Physical examination completed, anticipatory guidance provided, and risk stratification labs obtained. We will follow results as clinically guided. Otherwise return for annual exam next year.

## 2021-03-23 NOTE — Patient Instructions (Signed)
-   Start meloxicam once daily with food x 2 weeks - Start home exercises and perform x 4-6 weeks - Continue with activities as tolerated - Obtain fasting labs with orders - Return in 1 year for annual physical

## 2021-03-28 LAB — CBC
Hematocrit: 44 % (ref 37.5–51.0)
Hemoglobin: 14.7 g/dL (ref 13.0–17.7)
MCH: 28.7 pg (ref 26.6–33.0)
MCHC: 33.4 g/dL (ref 31.5–35.7)
MCV: 86 fL (ref 79–97)
Platelets: 229 10*3/uL (ref 150–450)
RBC: 5.13 x10E6/uL (ref 4.14–5.80)
RDW: 12.6 % (ref 11.6–15.4)
WBC: 4.5 10*3/uL (ref 3.4–10.8)

## 2021-03-28 LAB — COMPREHENSIVE METABOLIC PANEL
ALT: 17 IU/L (ref 0–44)
AST: 19 IU/L (ref 0–40)
Albumin/Globulin Ratio: 2.6 — ABNORMAL HIGH (ref 1.2–2.2)
Albumin: 4.5 g/dL (ref 4.0–5.0)
Alkaline Phosphatase: 67 IU/L (ref 44–121)
BUN/Creatinine Ratio: 11 (ref 9–20)
BUN: 14 mg/dL (ref 6–24)
Bilirubin Total: 0.6 mg/dL (ref 0.0–1.2)
CO2: 23 mmol/L (ref 20–29)
Calcium: 9.1 mg/dL (ref 8.7–10.2)
Chloride: 104 mmol/L (ref 96–106)
Creatinine, Ser: 1.24 mg/dL (ref 0.76–1.27)
Globulin, Total: 1.7 g/dL (ref 1.5–4.5)
Glucose: 100 mg/dL — ABNORMAL HIGH (ref 65–99)
Potassium: 4.8 mmol/L (ref 3.5–5.2)
Sodium: 142 mmol/L (ref 134–144)
Total Protein: 6.2 g/dL (ref 6.0–8.5)
eGFR: 75 mL/min/{1.73_m2} (ref >59)

## 2021-03-28 LAB — LIPID PANEL
Chol/HDL Ratio: 4.9 ratio (ref 0.0–5.0)
Cholesterol, Total: 188 mg/dL (ref 100–199)
HDL: 38 mg/dL — ABNORMAL LOW (ref >39)
LDL Chol Calc (NIH): 132 mg/dL — ABNORMAL HIGH (ref 0–99)
Triglycerides: 97 mg/dL (ref 0–149)
VLDL Cholesterol Cal: 18 mg/dL (ref 5–40)

## 2021-03-28 LAB — TSH RFX ON ABNORMAL TO FREE T4: TSH: 2.51 u[IU]/mL (ref 0.450–4.500)

## 2021-03-28 LAB — VITAMIN D 25 HYDROXY (VIT D DEFICIENCY, FRACTURES): Vit D, 25-Hydroxy: 41.3 ng/mL (ref 30.0–100.0)

## 2021-03-28 LAB — HEPATITIS C ANTIBODY: Hep C Virus Ab: 0.1 s/co ratio (ref 0.0–0.9)

## 2021-03-28 LAB — HIV ANTIBODY (ROUTINE TESTING W REFLEX): HIV Screen 4th Generation wRfx: NONREACTIVE

## 2021-03-28 NOTE — Progress Notes (Signed)
Your labs shows that your "bad" (LDL) cholesterol is bit high and your "good" (HDL) cholesterol is just under the desired range. Your fasting blood sugar was right above the range which is a diabetes concern.   At this stage the recommendation is lifestyle changes, see below:  Diet & Exercise Recommendations Dietary changes to include reducing saturated fats, sodium, avoiding red meat, fried, processed foods, full-fat dairy, baked goods, and sweets. Incorporate more fruits, vegetables, fiber-rich foods such as whole grains, and transition to more eggs and lean meats. Make realistic changes where you can and stick to it!  Physical activity should be focused on getting 150 to 300 minutes per week of moderate to vigorous activity (30 minutes of activity at least 5 days of the week at a level of intensity where you can carry on a conversation without being out of breath). However, any increase in activity is beneficial to your health! Physical activity reduces symptoms of depression and anxiety and improves sleep quality (increase the time in deep sleep and reduce daytime sleepiness). Benefits include weight loss, improved insulin sensitivity, less adiposity, and increased bone health. Walking lowers systolic and diastolic blood pressures as well as your resting heart rate.  Cognitive benefits of exercise include short-term improvements in executive function, memory, processing speed, attention, and academic performance Increasing physical activity as we age can help [place] maintain independence by reducing cognitive decline and falls.

## 2021-06-01 ENCOUNTER — Ambulatory Visit (INDEPENDENT_AMBULATORY_CARE_PROVIDER_SITE_OTHER): Payer: BC Managed Care – PPO

## 2021-06-01 ENCOUNTER — Other Ambulatory Visit: Payer: Self-pay

## 2021-06-01 DIAGNOSIS — Z23 Encounter for immunization: Secondary | ICD-10-CM | POA: Diagnosis not present

## 2021-06-05 ENCOUNTER — Telehealth: Payer: Self-pay

## 2021-06-05 NOTE — Telephone Encounter (Signed)
Copied from CRM [id number]. Topic: General - Inquiry >> Jun 05, 2021 12:41 PM Daphine Deutscher D wrote: Reason for CRM: Pt got labs n Aug 1 and the insurance did not cover them  He thinks that Dr. Jerolyn Center put in the wrong codes for the Vit D and thyroid.  He wants to know if they can be reviewed and re-coded and sent back to insurance.Marland Kitchen  CB#  [phone]

## 2021-06-09 NOTE — Telephone Encounter (Signed)
Called and spoke with add-on testing department who stated that I needed to speak with billing about the issue.  Waited on hold for 15 minutes before I hung up.  Will try again next week.  For your information.   Codes to be added on:  Z13.29- Screening for thyroid disorder E55.9- Vitamin D deficiency  Specimen #: [id number]

## 2021-06-21 NOTE — Telephone Encounter (Signed)
Spoke with Ruben Robinson with [facility] billing who has updated the codes to the correlating labs.  Patient can disregard current bill.  Processing of new codes takes 30-45 days.  Patient notified and verbalized understanding.  No further questions at this time. For your information.

## 2022-01-05 HISTORY — PX: VASECTOMY: SHX75

## 2022-02-01 ENCOUNTER — Ambulatory Visit
Admission: EM | Admit: 2022-02-01 | Discharge: 2022-02-01 | Disposition: A | Discharge status: Home / Self Care | Payer: BC Managed Care – PPO | Attending: Emergency Medicine | Admitting: Emergency Medicine

## 2022-02-01 DIAGNOSIS — J029 Acute pharyngitis, unspecified: Secondary | ICD-10-CM | POA: Diagnosis present

## 2022-02-01 LAB — GROUP A STREP BY PCR: Group A Strep by PCR: NOT DETECTED

## 2022-02-01 MED ORDER — IBUPROFEN 600 MG PO TABS: 600.0000 mg | ORAL_TABLET | Freq: Four times a day (QID) | ORAL | 0 refills | Status: DC | PRN

## 2022-02-01 NOTE — Discharge Instructions (Addendum)
I will contact you if and only if your strep is positive.  I will call in penicillin if it is positive.  In the meantime 1 gram of Tylenol and 600 mg ibuprofen together 3-4 times a day as needed for pain.  Make sure you drink plenty of extra fluids.  Some people find salt water gargles and  Traditional Medicinal's "Throat Coat" tea helpful. Take 5 mL of liquid Benadryl and 5 mL of Maalox. Mix it together, and then hold it in your mouth for as long as you can and then swallow. You may do this 4 times a day.    Go to www.goodrx.com  or www.costplusdrugs.com to look up your medications. This will give you a list of where you can find your prescriptions at the most affordable prices. Or ask the pharmacist what the cash price is, or if they have any other discount programs available to help make your medication more affordable. This can be less expensive than what you would pay with insurance.
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
## 2022-02-01 NOTE — ED Triage Notes (Signed)
Patient presents to UC with a sore throat -- started Tuesday evening.  Patient denies fevers.

## 2022-03-23 ENCOUNTER — Ambulatory Visit (INDEPENDENT_AMBULATORY_CARE_PROVIDER_SITE_OTHER): Payer: BC Managed Care – PPO | Admitting: Family Medicine

## 2022-03-23 ENCOUNTER — Encounter: Payer: Self-pay | Admitting: Family Medicine

## 2022-03-23 VITALS — BP 120/80 | HR 60 | Ht 73.0 in | Wt 262.8 lb

## 2022-03-23 DIAGNOSIS — R7989 Other specified abnormal findings of blood chemistry: Secondary | ICD-10-CM | POA: Diagnosis not present

## 2022-03-23 DIAGNOSIS — Z0000 Encounter for general adult medical examination without abnormal findings: Secondary | ICD-10-CM | POA: Diagnosis not present

## 2022-03-23 DIAGNOSIS — Z13220 Encounter for screening for lipoid disorders: Secondary | ICD-10-CM | POA: Diagnosis not present

## 2022-03-23 DIAGNOSIS — R5383 Other fatigue: Secondary | ICD-10-CM

## 2022-03-23 NOTE — Patient Instructions (Signed)
-   Obtain fasting labs with orders provided (can have water or black coffee but otherwise no food or drink x 8 hours before labs) °- Review information provided °- Attend eye doctor annually, dentist every 6 months, work towards or maintain 30 minutes of moderate intensity physical activity at least 5 days per week, and consume a balanced diet °- Return in 1 year for physical °- Contact us for any questions between now and then °

## 2022-03-24 LAB — COMPREHENSIVE METABOLIC PANEL
ALT: 21 IU/L (ref 0–44)
AST: 21 IU/L (ref 0–40)
Albumin/Globulin Ratio: 2 (ref 1.2–2.2)
Albumin: 4.5 g/dL (ref 4.1–5.1)
Alkaline Phosphatase: 74 IU/L (ref 44–121)
BUN/Creatinine Ratio: 14 (ref 9–20)
BUN: 17 mg/dL (ref 6–24)
Bilirubin Total: 0.5 mg/dL (ref 0.0–1.2)
CO2: 21 mmol/L (ref 20–29)
Calcium: 8.9 mg/dL (ref 8.7–10.2)
Chloride: 106 mmol/L (ref 96–106)
Creatinine, Ser: 1.24 mg/dL (ref 0.76–1.27)
Globulin, Total: 2.2 g/dL (ref 1.5–4.5)
Glucose: 93 mg/dL (ref 70–99)
Potassium: 4.4 mmol/L (ref 3.5–5.2)
Sodium: 141 mmol/L (ref 134–144)
Total Protein: 6.7 g/dL (ref 6.0–8.5)
eGFR: 75 mL/min/{1.73_m2} (ref >59)

## 2022-03-24 LAB — VITAMIN D 25 HYDROXY (VIT D DEFICIENCY, FRACTURES): Vit D, 25-Hydroxy: 34.9 ng/mL (ref 30.0–100.0)

## 2022-03-24 LAB — CBC
Hematocrit: 44.9 % (ref 37.5–51.0)
Hemoglobin: 14.7 g/dL (ref 13.0–17.7)
MCH: 28.1 pg (ref 26.6–33.0)
MCHC: 32.7 g/dL (ref 31.5–35.7)
MCV: 86 fL (ref 79–97)
Platelets: 253 10*3/uL (ref 150–450)
RBC: 5.24 x10E6/uL (ref 4.14–5.80)
RDW: 13.3 % (ref 11.6–15.4)
WBC: 4.9 10*3/uL (ref 3.4–10.8)

## 2022-03-24 LAB — APO A1 + B + RATIO
Apolipo. B/A-1 Ratio: 1.1 ratio — ABNORMAL HIGH (ref 0.0–0.7)
Apolipoprotein A-1: 112 mg/dL (ref 101–178)
Apolipoprotein B: 121 mg/dL — ABNORMAL HIGH (ref <90)

## 2022-03-24 LAB — TSH: TSH: 3.3 u[IU]/mL (ref 0.450–4.500)

## 2022-03-24 LAB — LIPID PANEL
Chol/HDL Ratio: 5.6 ratio — ABNORMAL HIGH (ref 0.0–5.0)
Cholesterol, Total: 214 mg/dL — ABNORMAL HIGH (ref 100–199)
HDL: 38 mg/dL — ABNORMAL LOW (ref >39)
LDL Chol Calc (NIH): 161 mg/dL — ABNORMAL HIGH (ref 0–99)
Triglycerides: 83 mg/dL (ref 0–149)
VLDL Cholesterol Cal: 15 mg/dL (ref 5–40)

## 2022-03-28 NOTE — Assessment & Plan Note (Signed)
Annual examination completed, risk stratification labs ordered, anticipatory guidance provided.  We will follow labs once resulted. 
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
## 2022-06-15 ENCOUNTER — Telehealth: Payer: Self-pay | Admitting: Family Medicine

## 2022-06-15 NOTE — Telephone Encounter (Signed)
Copied from [place] [id number]. Topic: General - Other >> Jun 15, 2022 12:02 PM [place] B wrote: Reason for CRM: Patient called in about bill he received for physical he had July of last year, says the code that was used last July doesn't match for physical. He already spoke to billing and was told nothing they can do. Please call back.

## 2022-06-20 NOTE — Telephone Encounter (Signed)
The TSH needs to be coded.

## 2022-06-25 NOTE — Telephone Encounter (Signed)
Added R53.83, takes 30 days to process

## 2023-03-26 ENCOUNTER — Ambulatory Visit (INDEPENDENT_AMBULATORY_CARE_PROVIDER_SITE_OTHER): Payer: BC Managed Care – PPO | Admitting: Family Medicine

## 2023-03-26 ENCOUNTER — Encounter: Payer: Self-pay | Admitting: Family Medicine

## 2023-03-26 VITALS — BP 122/80 | HR 60 | Ht 73.0 in | Wt 223.0 lb

## 2023-03-26 DIAGNOSIS — E559 Vitamin D deficiency, unspecified: Secondary | ICD-10-CM | POA: Diagnosis not present

## 2023-03-26 DIAGNOSIS — R5383 Other fatigue: Secondary | ICD-10-CM

## 2023-03-26 DIAGNOSIS — E782 Mixed hyperlipidemia: Secondary | ICD-10-CM | POA: Diagnosis not present

## 2023-03-26 DIAGNOSIS — Z0000 Encounter for general adult medical examination without abnormal findings: Secondary | ICD-10-CM

## 2023-03-26 DIAGNOSIS — R6882 Decreased libido: Secondary | ICD-10-CM

## 2023-03-26 DIAGNOSIS — Z125 Encounter for screening for malignant neoplasm of prostate: Secondary | ICD-10-CM
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
## 2023-03-26 NOTE — Patient Instructions (Signed)
-   Obtain fasting labs with orders provided (can have water or black coffee but otherwise no food or drink x 8 hours before labs) °- Review information provided °- Attend eye doctor annually, dentist every 6 months, work towards or maintain 30 minutes of moderate intensity physical activity at least 5 days per week, and consume a balanced diet °- Return in 1 year for physical °- Contact us for any questions between now and then °

## 2023-03-26 NOTE — Assessment & Plan Note (Signed)
Doing very well, significant dietary changes leading to intentional weight loss. Annual examination completed, risk stratification labs ordered, anticipatory guidance provided.  We will follow labs once resulted.

## 2023-06-05 ENCOUNTER — Encounter: Payer: Self-pay | Admitting: Family Medicine

## 2023-06-05 NOTE — Telephone Encounter (Signed)
Please advise 

## 2023-08-06 ENCOUNTER — Encounter: Payer: Self-pay | Admitting: Family Medicine

## 2023-08-06 NOTE — Telephone Encounter (Signed)
Please review and advise.    Thank you.

## 2024-03-26 ENCOUNTER — Encounter: Payer: Self-pay | Admitting: Family Medicine

## 2024-04-02 ENCOUNTER — Encounter: Payer: Self-pay | Admitting: Family Medicine

## 2024-04-02 ENCOUNTER — Ambulatory Visit (INDEPENDENT_AMBULATORY_CARE_PROVIDER_SITE_OTHER): Admitting: Family Medicine

## 2024-04-02 VITALS — BP 110/70 | HR 70 | Ht 73.0 in | Wt 249.8 lb

## 2024-04-02 DIAGNOSIS — E782 Mixed hyperlipidemia: Secondary | ICD-10-CM | POA: Insufficient documentation

## 2024-04-02 DIAGNOSIS — Z0000 Encounter for general adult medical examination without abnormal findings: Secondary | ICD-10-CM | POA: Diagnosis not present

## 2024-04-02 DIAGNOSIS — E669 Obesity, unspecified: Secondary | ICD-10-CM | POA: Diagnosis not present

## 2024-04-02 NOTE — Assessment & Plan Note (Signed)
 Annual examination completed, risk stratification labs ordered, anticipatory guidance provided.  We will follow labs once resulted.

## 2024-04-02 NOTE — Assessment & Plan Note (Signed)
 Hyperlipidemia - History of elevated cholesterol levels, with significant improvement during weight loss on the [employer] program. - During weight loss, total cholesterol was lower than LDL levels six weeks prior to that period. - Cholesterol levels have increased again following recent weight gain. - Interested in current cholesterol levels.   Hypercholesterolemia Elevated cholesterol levels with improvement during weight loss. Current levels pending lab results. ASCVD risk score at 1.5%, below statin therapy threshold. Strong correlation between weight management and cholesterol levels noted. - Order labs including cholesterol panel to assess current levels. - Discuss potential initiation of statin therapy if ASCVD risk score exceeds 5% based on lab results. - Encourage continued lifestyle modifications to manage cholesterol levels.
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
## 2024-04-02 NOTE — Assessment & Plan Note (Signed)
 Body weight fluctuations and weight management - Weight has fluctuated over the years: 254 pounds in 2010, 200-205 pounds in 2013, and 210-230 pounds in recent years. - Recent weight gain of approximately 15 pounds since May, attributed to a hectic summer and work-related stress. - Able to lose weight with discipline, previously lost 30 pounds in six weeks using the [employer] program, reaching 210 pounds. - Difficulty maintaining weight loss, with stress and psychological factors leading to overeating and indulgence in high-calorie foods, especially when routines are disrupted (e.g., eating out at [employer] due to a busy schedule). - Seeking a sustainable approach to maintain weight loss.   Obesity Chronic weight management issues with recent 15-pound gain. Previous success with [employer] diet (premade meals) but we discussed concerns about sustainability. Psychological aspects of eating and stress impact on weight discussed. Need for a sustainable, long-term plan emphasized. - Refer to a nutritionist for personalized dietary planning and support. - Encourage tracking of caloric intake and planning for occasional indulgences to maintain a sustainable diet. - Discuss the potential use of MyFitnessPal or similar apps to monitor caloric intake and plan meals. - Emphasize the importance of stress management and its impact on weight. - Reviewed information on set point and make changes where appropriate.

## 2024-04-02 NOTE — Patient Instructions (Signed)
 Patient Plan for Post-Visit Guidance  Healthcare Maintenance - Complete all ordered labs: CBC, comprehensive metabolic panel with GFR, hemoglobin A1c, lipid panel, PSA. - Await lab results for further recommendations.  Cholesterol and Weight Management - Complete cholesterol panel as ordered. - Continue lifestyle changes to help manage cholesterol and weight. - If your ASCVD risk score is above 5% after labs, discuss possible statin therapy at your next visit. - Referral placed for nutrition and diabetic education for personalized dietary planning and support. - Track caloric intake and plan for occasional indulgences to help maintain a sustainable diet. - Consider using MyFitnessPal or a similar app to monitor calories and plan meals. - Focus on stress management, as stress can impact eating habits and weight.  Red Flags: - If you experience chest pain, shortness of breath, sudden swelling, severe fatigue, or any new or concerning symptoms, contact the office immediately.

## 2024-04-03 ENCOUNTER — Ambulatory Visit: Payer: Self-pay | Admitting: Family Medicine

## 2024-04-03 LAB — COMPREHENSIVE METABOLIC PANEL WITH GFR
ALT: 31 IU/L (ref 0–44)
AST: 60 IU/L — ABNORMAL HIGH (ref 0–40)
Albumin: 4.6 g/dL (ref 4.1–5.1)
Alkaline Phosphatase: 78 IU/L (ref 44–121)
BUN/Creatinine Ratio: 13 (ref 9–20)
BUN: 17 mg/dL (ref 6–24)
Bilirubin Total: 0.5 mg/dL (ref 0.0–1.2)
CO2: 21 mmol/L (ref 20–29)
Calcium: 9.3 mg/dL (ref 8.7–10.2)
Chloride: 103 mmol/L (ref 96–106)
Creatinine, Ser: 1.27 mg/dL (ref 0.76–1.27)
Globulin, Total: 2.4 g/dL (ref 1.5–4.5)
Glucose: 89 mg/dL (ref 70–99)
Potassium: 4.5 mmol/L (ref 3.5–5.2)
Sodium: 140 mmol/L (ref 134–144)
Total Protein: 7 g/dL (ref 6.0–8.5)
eGFR: 72 mL/min/1.73 (ref >59)

## 2024-04-03 LAB — HEMOGLOBIN A1C
Est. average glucose Bld gHb Est-mCnc: 108 mg/dL
Hgb A1c MFr Bld: 5.4 % (ref 4.8–5.6)

## 2024-04-03 LAB — CBC
Hematocrit: 47.2 % (ref 37.5–51.0)
Hemoglobin: 15.5 g/dL (ref 13.0–17.7)
MCH: 28.9 pg (ref 26.6–33.0)
MCHC: 32.8 g/dL (ref 31.5–35.7)
MCV: 88 fL (ref 79–97)
Platelets: 223 x10E3/uL (ref 150–450)
RBC: 5.36 x10E6/uL (ref 4.14–5.80)
RDW: 12.7 % (ref 11.6–15.4)
WBC: 4.3 x10E3/uL (ref 3.4–10.8)

## 2024-04-03 LAB — LIPID PANEL
Chol/HDL Ratio: 4.7 ratio (ref 0.0–5.0)
Cholesterol, Total: 207 mg/dL — ABNORMAL HIGH (ref 100–199)
HDL: 44 mg/dL (ref >39)
LDL Chol Calc (NIH): 147 mg/dL — ABNORMAL HIGH (ref 0–99)
Triglycerides: 88 mg/dL (ref 0–149)
VLDL Cholesterol Cal: 16 mg/dL (ref 5–40)

## 2024-04-03 LAB — PSA TOTAL (REFLEX TO FREE): Prostate Specific Ag, Serum: 0.4 ng/mL (ref 0.0–4.0)

## 2024-04-08 ENCOUNTER — Other Ambulatory Visit: Payer: Self-pay | Admitting: Medical Genetics

## 2024-04-24 ENCOUNTER — Other Ambulatory Visit: Payer: Self-pay | Admitting: Medical Genetics

## 2024-04-24 DIAGNOSIS — Z006 Encounter for examination for normal comparison and control in clinical research program: Secondary | ICD-10-CM

## 2024-05-08 LAB — GENECONNECT MOLECULAR SCREEN: Genetic Analysis Overall Interpretation: NEGATIVE

## 2025-04-05 ENCOUNTER — Encounter: Admitting: Family Medicine
